# Patient Record
Sex: Male | Born: 1935 | Race: White | Hispanic: No | Marital: Single
Health system: Southern US, Community
[De-identification: ages and names within clinical notes are randomized; demographics above are authoritative.]

---

## 2005-10-04 ENCOUNTER — Inpatient Hospital Stay: Payer: Self-pay | Admitting: Internal Medicine

## 2005-10-05 ENCOUNTER — Other Ambulatory Visit: Payer: Self-pay

## 2005-11-14 ENCOUNTER — Ambulatory Visit: Payer: Self-pay | Admitting: Internal Medicine

## 2005-11-16 ENCOUNTER — Inpatient Hospital Stay: Payer: Self-pay | Admitting: Internal Medicine

## 2005-11-16 ENCOUNTER — Ambulatory Visit: Payer: Self-pay | Admitting: Gastroenterology

## 2005-11-16 ENCOUNTER — Other Ambulatory Visit: Payer: Self-pay

## 2005-12-25 ENCOUNTER — Emergency Department: Payer: Self-pay | Admitting: Emergency Medicine

## 2005-12-25 ENCOUNTER — Other Ambulatory Visit: Payer: Self-pay

## 2006-01-05 ENCOUNTER — Ambulatory Visit: Payer: Self-pay | Admitting: Nephrology

## 2006-03-01 ENCOUNTER — Emergency Department: Payer: Self-pay | Admitting: Emergency Medicine

## 2006-03-01 ENCOUNTER — Ambulatory Visit: Payer: Self-pay | Admitting: Cardiology

## 2006-03-01 ENCOUNTER — Inpatient Hospital Stay (HOSPITAL_COMMUNITY): Admission: AD | Admit: 2006-03-01 | Discharge: 2006-03-13 | Payer: Self-pay | Admitting: Neurology

## 2006-03-02 ENCOUNTER — Encounter: Payer: Self-pay | Admitting: Internal Medicine

## 2006-03-05 ENCOUNTER — Encounter (INDEPENDENT_AMBULATORY_CARE_PROVIDER_SITE_OTHER): Payer: Self-pay | Admitting: Neurology

## 2006-03-06 ENCOUNTER — Encounter: Payer: Self-pay | Admitting: Internal Medicine

## 2006-03-07 ENCOUNTER — Ambulatory Visit: Payer: Self-pay | Admitting: Infectious Diseases

## 2006-03-16 ENCOUNTER — Ambulatory Visit: Payer: Self-pay | Admitting: Nephrology

## 2006-03-26 ENCOUNTER — Ambulatory Visit: Payer: Self-pay | Admitting: Nephrology

## 2006-05-19 ENCOUNTER — Inpatient Hospital Stay (HOSPITAL_COMMUNITY): Admission: AD | Admit: 2006-05-19 | Discharge: 2006-05-23 | Payer: Self-pay | Admitting: Neurology

## 2006-05-19 ENCOUNTER — Emergency Department: Payer: Self-pay | Admitting: Emergency Medicine

## 2006-05-21 ENCOUNTER — Encounter (INDEPENDENT_AMBULATORY_CARE_PROVIDER_SITE_OTHER): Payer: Self-pay | Admitting: Neurology

## 2006-05-21 ENCOUNTER — Encounter (INDEPENDENT_AMBULATORY_CARE_PROVIDER_SITE_OTHER): Payer: Self-pay | Admitting: Interventional Cardiology

## 2006-08-30 ENCOUNTER — Ambulatory Visit: Payer: Self-pay | Admitting: Internal Medicine

## 2007-07-09 ENCOUNTER — Ambulatory Visit: Payer: Self-pay | Admitting: *Deleted

## 2007-07-09 ENCOUNTER — Encounter (INDEPENDENT_AMBULATORY_CARE_PROVIDER_SITE_OTHER): Payer: Self-pay | Admitting: Emergency Medicine

## 2007-07-09 ENCOUNTER — Inpatient Hospital Stay (HOSPITAL_COMMUNITY): Admission: EM | Admit: 2007-07-09 | Discharge: 2007-07-12 | Payer: Self-pay | Admitting: Emergency Medicine

## 2007-07-09 ENCOUNTER — Encounter (INDEPENDENT_AMBULATORY_CARE_PROVIDER_SITE_OTHER): Payer: Self-pay | Admitting: *Deleted

## 2007-07-10 ENCOUNTER — Ambulatory Visit: Payer: Self-pay | Admitting: Physical Medicine & Rehabilitation

## 2007-07-11 ENCOUNTER — Encounter (INDEPENDENT_AMBULATORY_CARE_PROVIDER_SITE_OTHER): Payer: Self-pay | Admitting: *Deleted

## 2007-07-12 ENCOUNTER — Inpatient Hospital Stay (HOSPITAL_COMMUNITY)
Admission: RE | Admit: 2007-07-12 | Discharge: 2007-07-23 | Payer: Self-pay | Admitting: Physical Medicine & Rehabilitation

## 2007-07-12 ENCOUNTER — Ambulatory Visit: Payer: Self-pay | Admitting: Physical Medicine & Rehabilitation

## 2008-10-05 ENCOUNTER — Inpatient Hospital Stay: Payer: Self-pay | Admitting: Internal Medicine

## 2009-04-16 ENCOUNTER — Inpatient Hospital Stay: Payer: Self-pay | Admitting: Internal Medicine

## 2009-05-10 ENCOUNTER — Ambulatory Visit: Payer: Self-pay | Admitting: Vascular Surgery

## 2009-06-07 ENCOUNTER — Inpatient Hospital Stay: Payer: Self-pay | Admitting: Internal Medicine

## 2009-07-19 ENCOUNTER — Emergency Department: Payer: Self-pay | Admitting: Emergency Medicine

## 2009-08-17 ENCOUNTER — Inpatient Hospital Stay: Payer: Self-pay | Admitting: Internal Medicine

## 2010-01-22 ENCOUNTER — Inpatient Hospital Stay: Payer: Self-pay | Admitting: Internal Medicine

## 2010-06-28 NOTE — Consult Note (Signed)
NAMEJASIR, [name]             ACCOUNT NO.:  [id number]   MEDICAL RECORD NO.:  [id number]          PATIENT TYPE:  INP   LOCATION:  3003                         FACILITY:  [facility]   PHYSICIAN:  Aram Beecham B. Eliott Nine, M.D.DATE OF BIRTH:  27-Dec-1922   DATE OF CONSULTATION:  DATE OF DISCHARGE:                                 CONSULTATION   We were asked to see this patient by [facility] Team, Dr. Darene Lamer.   REASON FOR CONSULTATION:  Hemodialysis.   HISTORY OF PRESENT ILLNESS:  This is an 77 year old male with a history  of hemorrhagic stroke admitted for right upper extremity weakness who  was found to have new acute left MCA multifocal infarct without  hemorrhage.  We were asked to see the patient for hemodialysis  disorders.  Per the patient and his family, he used to be 3 days a week  dialysis and then switched to 2, back to 3, now for about the past month  on a 2 days of dialysis Mondays and Fridays.  He has done well with this  regimen.  He is dialyzed for volume only. Of note, the patient's family  states that he tends to clot at about 2 hours and 30 minutes to 2 hours  and 45 minutes, although he is supposed to go 3 hours.  Over the top of  the course, the patient's has had an improved strength on the right  side.   PAST MEDICAL HISTORY:  History of:  1. Hemorrhagic stroke in 2008.  2. End-stage renal disease secondary to emboli, status post catheter      trauma.  3. History of aortic valve endocarditis.  4. CHF with EF of 20%-30%.  5. Aortic stenosis.  6. Afib.  7. Hyperlipidemia.   MEDICATIONS:  Currently on:  1. Aspirin 81 mg.  2. Celexa 20 mg daily.  3. Catapres 0.1 b.i.d.  4. Plavix 25 mg.  5. Cardizem 120 mg daily.  6. Lovenox 30 mg.  7. Toprol-XL 200 mg daily.  8. Protonix 40 mg daily.  9. Renagel 400 mg t.i.d.  Of note, the patient is on pravastatin at home 80 mg daily as well as  Lasix 20 mg daily.  At this hospital, the patient is currently on Zocor  40 mg  nightly.   Dialysis:  He dialyzes at [facility] [place]; however, he sees a  [facility] physician.  He dialysis Mondays and Wednesdays.  Estimated evaluated  sodium 1 kg, Lantus 3 hours, it was accessed with the PermaCath.  The  patient receives Epogen 1200 units with dialysis as well as Benicar 100  mL with dialysis.   SOCIAL HISTORY:  He lives with his son and daughter-in-law.  Denies  smoking, alcohol, or drugs.   FAMILY HISTORY:  Noncontributory as there are no kidney disease, stroke,  or hypertension.   REVIEW OF SYSTEMS:  Denies an API as well as denies vision changes,  chest pain, shortness of breath, dyspnea on exertion, muscle pain,  nausea, vomiting, or diarrhea.  Does complain of some lower extremity  edema and some weakness at his right side.   PHYSICAL  EXAMINATION:  VITAL SIGNS:  Temperature 98.4, pulse 70,  respiratory rate 20, blood pressure 160/100, previously 112/70, and O2  96% on room air.  I's and O's 480/0;  however, this is probably not  accurate.  GENERAL:  Not in acute distress.  HEENT:  Normocephalic and atraumatic.  Extraocular muscles are intact.  Sclerae are clear.  Moist mucous membranes.  NECK:  Supple without adenopathy.  No JVD.  CARDIOVASCULAR:  Regular rate and rhythm with 2/6 systolic ejection  murmur appreciated just over the right upper sternal border.  PULSES:  2+ equal bilaterally.  LUNGS:  Clear to auscultation bilaterally.  SKIN:  No lesions or rashes.  GI:  Soft and nontender.  Abdomen, no rebound or guarding.  EXTREMITIES:  He has 1+ pitting pedal edema.  ACCESS:  Perm graft without erythema or fluctuant discharge.  MUSCULOSKELETAL:  No joint deformities.  NEURO EXAM:  Alert and oriented x3.  Cranial nerves II through XII  grossly intact.  His right upper extremity has only 1/5 strength,  however he moves all fingers and has a strong grip.  He has weakness of  his right shoulder.  His left upper extremity is within normal limits.  His  lower extremities are actually equal bilaterally.   Head CT shows no new findings.  MRI showed acute posterior division left  MCA and multifocal infarct.  Superimposed over advanced chronic ischemic  disease.  Chest x-ray is negative.   LABORATORY DATA:  White blood cell 8.7, hemoglobin 14, hematocrit 11.6,  and platelets 207.  Sodium 141, potassium 4.2, chloride 105, bicarb 23,  BUN 17, creatinine 2.05, calcium 9.2, albumin 3.1.  AST 17, ALT 15, TSH  2.388, and INR is 1.   ASSESSMENT:  An 77 year old male with end-stage renal diseaseand  hemorragic  stroke.  1. End-stage renal disease:  Monday, Wednesday, and Friday      hemodialysis.  We have obtained records.  2. Acute cardiovascular accident:  Per primary team and neuro, patient      is on Plavix and aspirin.  He will go to rehab soon.  The patient      did not receive TPA.  3. Hypertension elevated currently but previously controlled.      Hemodialysis should help lower his blood pressure somewhat.  Per      primary team, clonidine and metoprolol.  4. Atrial fibrillation rate controlled now and actually is in sinus      rhythm.  Not on Coumadin secondary to previous      hemorrhagic stroke.  5. Congestive heart failure.  On Toprol-XL and aspirin.  6. Secondary hypoparathyroidism.  He does not appear to be on any      medication.      Johney Maine, M.D.  Electronically Signed     ______________________________  Duke Salvia Eliott Nine, M.D.    JT/MEDQ  D:  07/12/2007  T:  07/12/2007  Job:  [id number]

## 2010-06-28 NOTE — Consult Note (Signed)
NAMEBUZZ, AXEL             ACCOUNT NO.:  [id number]   MEDICAL RECORD NO.:  [id number]          PATIENT TYPE:  INP   LOCATION:  3003                         FACILITY:  [facility]   PHYSICIAN:  Deanna Artis. Hickling, M.D.DATE OF BIRTH:  05/20/1922   DATE OF CONSULTATION:  07/09/2007  DATE OF DISCHARGE:                                 CONSULTATION   CHIEF COMPLAINT:  Evaluate new left brain stroke.   HISTORY OF THE PRESENT CONDITION:  [name] is an 77 year old  gentleman who was admitted with a left posterior cerebral artery  distribution hemorrhagic stroke in January 2008.  The patient has  chronic renal failure and is on dialysis.  He recovered fairly well from  the stroke, and though he had problems with right visual field cut, he  was able to get around quite well with some help in terms of reminding  him to steer clear of objects in his path that he did not see.   The patient underwent dialysis and came home around 1:30 in the  afternoon of Jul 08, 2007.  He was last seen normal around 6 p.m.  He  felt tired and went down and took a nap until around 9:30.  When he  wakened, the right arm felt weak.  He was brought directly from his home  in [place] to __________ at [facility] at 11:20 p.m.  He was  beyond the limits of any rational therapy for him and for that reason,  he was admitted to the hospital for observation and evaluation.  He was  outside the [place] where interventional therapy could benefit him and  therefore was admitted on the hospital service for further evaluation  and we were consulted to assist in that evaluation.   PAST MEDICAL HISTORY:  The patient's initial stroke occurred in January  2008.  At that time, he was noted to have atrial fibrillation and also  aortic valve endocarditis.  He has a history of congestive heart failure  and has an ejection fraction of 20%-30%.  He has end-stage renal disease  and is on hemodialysis.  His risk  factors for stroke include  hypertension and dyslipidemia, in addition to his heart abnormalities.   PAST SURGICAL HISTORY:  The patient has had bilateral iridectomies.  He  has also had implantation of AV fistula for hemodialysis.  I am not  aware of other surgical procedures that the patient has had.   CURRENT MEDICATIONS:  1. Aspirin 325 mg daily.  2. Nephro-Vite 1 tablet daily.  3. Pravastatin 80 mg daily.  4. Cardizem CD 120 mg daily.  5. Metoprolol 200 mg daily.  6. Prilosec 20 mg daily.  7. Renagel 40 mg twice daily.  8. Celexa 20 mg daily.  9. Clonidine 0.1 mg twice daily.  10.Lasix 20 mg three times daily.   DRUG ALLERGIES:  HYDROCHLOROTHIAZIDE, SPIRIVA, and UROXATRAL.  I do not  know the reactions that he has had to those.   FAMILY HISTORY:  No history of strokes, renal failure, or hypertension.   SOCIAL HISTORY:  The patient lives with his son.  He walks with a  walker.  He does not use tobacco, alcohol, or drugs.   REVIEW OF SYSTEMS:  Twelve-system review is negative except as noted  above for other extensive medical problems.   PHYSICAL EXAMINATION:  GENERAL:  Today, this is a pleasant gentleman  sitting in chair with family surrounding him.  VITAL SIGNS:  Temperature 97.9, blood pressure 157/91, resting pulse 78,  respirations 18, oxygen saturation 97% on room air, weight 68.85 kg, and  height 71 inches.  EARS, NOSE, AND THROAT:  No infections.  No bruits.  NECK:  Supple neck.  LUNGS:  Clear.  HEART:  No murmurs.  Pulses normal.  ABDOMEN:  Soft.  Bowel sounds normal.  No hepatosplenomegaly.  EXTREMITIES:  Normal.  NEUROLOGIC EXAMINATION:  Mental Status:  The patient is awake.  His  memory is only fair.  He is able to name objects and follow simple  commands.  Cranial Nerves:  Round and reactive pupils, status post  iridectomy.  Visual fields show dense right homonymous hemianopsia.  Fundi show mild pallor of the disks.  He has a mild right central  seventh.   He can protrude his tongue and move it from side-to-side.  His  extraocular movements are full.   Motor Examination:  The patient's right arm sits in his lap.  He is not  able to elevate it against gravity.  He can flex the elbow 2/5, he  cannot extend it.  He has a wrist drop.  His grip is about 4/5.  He can  wiggle his fingers.  He can actually oppose his thumb to all 5 fingers  on the right side.  Shoulder shrug is 4/5.  Right leg, hip flexors and  psoas 4/5; the rest is 5/5.  He wiggles his toes a bit better on the  left side than the right.  The left side is normal.   Sensation:  He has variable testing but his arm seems to be more  effective than the legs and the face.  He has astereognosis in the right  hand.  He is not neglecting his right side.   Cerebellar Examination:  Finger-to-nose and rapid alternating movements  were okay on the left, cannot be test on the right.  Gait, he falls to  the right side per his family, I did not get him up.  Deep tendon  reflexes are diminished.  He had bilateral flexor plantar responses.   IMPRESSION:  1. I have reviewed the patient's MRI scan and it shows 3 acute      nonhemorrhagic lesions involving the posterior frontal and parietal      lobes, probably the central and angular branches of the middle      cerebral artery.  In all likelihood, these are cardioembolic in      nature from atrial fibrillation, low ejection fraction, and aortic      valve disease. (434.11)  2. Remote left posterior cerebral artery distribution infarction,      previously hemorrhagic transformation.  3. Risk factors for stroke include hypertension and dyslipidemia, in      addition to his cardiac disease.   RECOMMENDATIONS:  I agree with 2-D echocardiogram and carotid Doppler  which are being done.  The Doppler shows right moderate homogeneous  plaque in the bifurcation in the internal carotid artery without  stenosis.  The left internal carotid shows  moderate heterogeneous plaque  in the bifurcation in the internal carotid, 60%-80% internal carotid  artery stenosis  on the low end of the scale.   Hemoglobin A1c 5.2, serum homocysteine 11.5, fasting lipid panel, total  cholesterol 125, HDL normal at 41, LDL normal at 72, triglycerides  normal at 62, and VLDL calculated at 12.   I would consider placing this man on Plavix 75 mg and place his aspirin  because of a lower chance of bleeding and equal efficacy in terms of  stroke.  Neither of these will work as well as Coumadin, but neither  will be as dangerous as Coumadin would be for a guy who has already had  hemorrhagic transformation and he will remain at risk for further  stroke.  To this, we need to add the significant carotid artery  stenosis, but also his MRA shows narrowing in the carotid siphon much  more so on the left than the right and very irregular middle cerebral  artery vessels on the left side in comparison with the right.  He shows  a left posterior cerebral artery that has been cut off reflecting his  previous stroke.  The proximal vertebrobasilar vessels look fine and  proximal carotid vessels look fine prior to the siphon.   I do not think that this is a gentleman who is going to be a good  candidate for endovascular treatment at this time.  I will discuss this  with my partner, Dr. Pearlean Brownie.  His swallowing study was passed and so he  is on a heart-healthy normal-consistency diet.  He needs physical and  occupational therapy and inpatient rehabilitation evaluation and because  of his 24/7 care, he will definitely benefit from rehab and should be  taken by them.   I appreciate the opportunity to participate in his care.  If you have  any questions, do not hesitate to contact me.      Deanna Artis. Sharene Skeans, M.D.  Electronically Signed     [facility]/MEDQ  D:  07/09/2007  T:  07/10/2007  Job:  [id number]   cc:   Elliot Cousin, M.D.  Alonna Buckler, MD

## 2010-06-28 NOTE — H&P (Signed)
[name], GOLDWIRE NO.:  [id number]   MEDICAL RECORD NO.:  [id number]          PATIENT TYPE:  IPS   LOCATION:  4039                         FACILITY:  [facility]   PHYSICIAN:  Ellwood Dense, M.D.   DATE OF BIRTH:  1922/02/24   DATE OF ADMISSION:  07/12/2007  DATE OF DISCHARGE:                              HISTORY & PHYSICAL   PRIMARY CARE PHYSICIAN:  Dr. Randa Lynn in [place].   RENAL:  Dr. Hoy Morn in [place].   NEUROLOGIST:  Dr. Sharene Skeans.   LOCAL NEPHROLOGIST:  [facility].   HISTORY OF PRESENT ILLNESS:  Mr. [name] is an 77 year old Caucasian  male with history of left posterior circulation hemorrhage February 21, 2007, which left him with very little residual deficits, although he has  slight vision problems with peripheral vision on the right.  He also has  a history of end-stage renal disease for approximately the past year and  half, but presently he is on hemodialysis just twice a week with some  improvement in kidney function reported.   The patient was admitted to the hospital on Jul 09, 2007 with new right-  sided weakness.  MRI study of the brain showed an acute posterior left  middle cerebral artery distribution infarct with evidence of multiple  focal infarcts diffusely.  MRI study of the brain showed an acute  proximal occlusion.  Echocardiogram showed an ejection fraction of 55-  60% without emboli.  Carotid Doppler showed left internal carotid artery  stenosis of 60-80%.  Dr. Sharene Skeans from Neurology recommended aspirin and  Plavix and subcu Lovenox for DVT prophylaxis.  [facility] have been arranging dialysis presently q. Monday and Friday.  The patient was evaluated by the Rehabilitation physicians and felt to  be an appropriate candidate for inpatient rehabilitation.   REVIEW OF SYSTEMS:  Positive for reflux.   PAST MEDICAL HISTORY:  1. History of posterior circulation hemorrhage in January 2008 with    minimal residual deficits, although with some visual problems on      the right.  2. End-stage renal disease with hemodialysis for 18 months, presently      on hemodialysis 2 times per week.  3. Hypertension.  4. Dyslipidemia.  5. Congestive heart failure with ejection fraction of 20-30%.  6. Atrial fibrillation.  7. Aortic valve endocarditis.  8. Depression.   FAMILY HISTORY:  Positive for coronary artery disease.   SOCIAL HISTORY:  The patient lives with one of his sons and his daughter-  in-law in home with 24-hour assistance.  There is also a local health  that is hired.  He is not left alone anytime of the day.  He does get  out on a regular basis and in fact attended music festival 3 days just  couple weeks ago.  The patient does not use alcohol or tobacco.  He  lives in one level home with four steps to enter.   FUNCTIONAL HISTORY PRIOR TO ADMISSION:  Independent for the most part  using a walker for fairly good distances.   ALLERGIES:  UROXATRAL, HYDROCHLOROTHIAZIDE, SPIRIVA.   MEDICATIONS  PRIOR TO ADMISSION:  1. Aspirin daily.  2. Nephro-Vite daily.  3. Pravastatin 80 mg daily.  4. Cardizem 120 mg daily.  5. Metoprolol 200 mg daily.  6. Lasix 20 mg q. Tuesday, Thursday, Saturday, and Sunday.  7. Clonidine 0.1 mg b.i.d.  8. Celexa 20 mg daily.  9. Renagel 400 mg b.i.d.  10.Prilosec 20 mg daily.   LABORATORY:  Recent hemoglobin was 14.0 with hematocrit of 41.6,  platelet count of 207,000, white count of 8.7.  Recent hemoglobin A1c  was 5.2.  Recent sodium is 141, potassium 4.2, chloride 105, bicarbonate  28, BUN 17, and creatinine 2.05.   PHYSICAL EXAMINATION:  GENERAL:  Reasonably well-appearing elderly adult  male lying in bed with his son present.  VITAL SIGNS:  Blood pressure is 157/88 with pulse of 65, respiratory  rate 20, and temperature 98.0.  HEENT:  Normocephalic and nontraumatic.  CARDIOVASCULAR:  Irregular rate and rhythm, S1-S2 without murmurs.   ABDOMEN:  Soft, nontender with positive bowel sounds.  LUNGS:  Clear to auscultation bilaterally.  NEUROLOGIC:  Alert and oriented x2-3 with occasional Qs.  Cranial nerve  exam showed decreased visual acuity in the right peripheral field, but  extraocular muscles were intact and tongue was in the midline.  Facial  symmetry was intact and sensation was intact in the bilateral face.  Left upper and left lower extremity strength were generally 4+/5.  Bulk  was decreased and tone was normal.  Sensation was intact to light touch  in the left upper and left lower extremity.  Right upper extremity  strength was 3-/5 with decreased sensation and decreased tone with  fairly decreased bulk.  Right lower extremity strength was generally 3+  to 4-/5 with decreased sensation.   IMPRESSION:  1. Status post left middle cerebral artery infarct with right      hemiparesis and right hemisensory loss.  2. History of prior left posterior circulation hemorrhage January      20 08.  3. End-stage renal disease, presently on hemodialysis 2 times per      week.  4. Atrial fibrillation.   Presently, the patient has deficits in ADLs, transfers and ambulation  related to the above-noted stroke.   PLAN:  1. Admit to the rehabilitation unit for daily therapies to include      physical therapy for range of motion, strengthening, bed mobility,      transfers, pregait training, gait training, and equipment      evaluation.  2. Occupation therapy for range of motion, strengthening, ADLs,      cognitive/perceptual training, splinting, and equipment evaluation.  3. Rehab nursing for skin care, wound care, and bowel and bladder      training if necessary.  4. Speech therapy for higher level cognition and evaluation of swallow      as necessary.  5. Case management to assess home environment, assist with discharge      planning, and arrange for appropriate followup care.  6. Social Work to assess family and social  support and assist in      discharge planning.  7. Continue renal diet 70-2-2, 1200 mL fluid restriction diet.  8. Check admission labs doing routine hemodialysis lab draws.  9. Tylenol 325 mg 1-2 tablets p.o. q.4 h. p.r.n. for pain.  10.Pravachol 80 mg p.o. nightly.  11.Routine turning to prevent skin breakdown.  12.Aspirin 81 mg p.o. daily.  13.Celexa 20 mg p.o. daily.  14.Clonidine 0.1 mg p.o. b.i.d.  15.Plavix 75 mg p.o.  daily.  16.Aranesp 12.5 mcg IV q. Friday after dialysis.  17.Cardizem 120 mg p.o. daily.  18.Subcu Lovenox 30 mg daily.  19.Venofer 100 mg IV q. Monday after dialysis.  20.Toprol 200 mg p.o. daily.  21.Protonix 40 mg p.o. daily.  22.Nephro-Vite one p.o. daily.  23.Renagel 400 mg p.o. b.i.d.  24.Dialysis per [facility] q. Monday and Friday.  25.Strict in's and out's.  26.Full supervision for meals.   PROGNOSIS:  Good.   ESTIMATED LENGTH OF STAY:  10-15 days.   GOALS:  Made assist to supervision ADLs, transfers, and ambulation with  the device.           ______________________________  Ellwood Dense, M.D.     DC/MEDQ  D:  07/12/2007  T:  07/13/2007  Job:  [id number]

## 2010-06-28 NOTE — Discharge Summary (Signed)
[name], [name]             ACCOUNT NO.:  [id number]   MEDICAL RECORD NO.:  [id number]          PATIENT TYPE:  INP   LOCATION:  3003                         FACILITY:  [facility]   PHYSICIAN:  Isidor Holts, M.D.  DATE OF BIRTH:  06/10/1922   DATE OF ADMISSION:  07/08/2007  DATE OF DISCHARGE:  07/12/2007                               DISCHARGE SUMMARY   PRIMARY MEDICAL DOCTOR:  Dr. Randa Lynn, [place], [place].   DISCHARGE DIAGNOSES:  1. Acute cerebrovascular accident, posterior division left middle      cerebral artery territory  2. End-stage renal disease on hemodialysis.  3. Hypertension.  4. Atrial fibrillation.  5. Dyslipidemia.  6. History of congestive cardiomyopathy.  7. History of left posterior cerebral artery distribution hemorrhagic      cerebrovascular accident, January 2008.   DISCHARGE MEDICATIONS:  1. Protonix 40 mg p.o. daily.  2. Nephro-Vite tablet 1 p.o. daily.  3. Cardizem CD 120 mg p.o. daily.  4. Metoprolol succinate (Toprol XL) 200 mg p.o. daily.  5. Celexa 20 mg p.o. daily.  6. Clonidine 0.1 mg p.o. b.i.d.  7. Renagel 400 mg p.o. b.i.d.  8. Plavix 75 mg p.o. daily.  9. Aspirin 81 mg p.o. daily.  10.Pravastatin 80 mg p.o. daily.   PROCEDURES:  1. Head CT scan dated Jul 09, 2007, this showed no acute abnormality.      There was stable old large left occipital and temporal lobe      infarct.  Stable extensive patchy white matter chronic small vessel      ischemic changes in both cerebral hemispheres, stable atrophy.  2. Chest x-ray dated Jul 09, 2007, this showed no acute thoracic      findings.  3. Brain MRI dated Jul 09, 2007, this showed acute posterior division      left MCA territory multifocal infarct, superimposed on advanced      chronic ischemic disease.  No associated acute hemorrhage or mass      effect.  4. Head MRA dated Jul 09, 2007, this was degraded by motion artifact.      There was stable intracranial atherosclerosis  without acute      proximal occlusion identified.  Decreased conspicuity of left ICA      siphon 4 mm aneurysm, probably due to suboptimal quality of this      exam, stable attenuated flow in the left PCA.  5. Bilateral carotid/vertebral artery duplex dated Jul 09, 2007, this      showed on the right moderate plaquing bifurcation, no ICA stenosis.      Vertebral artery not visualized.  On the left, moderate plaquing      with bifurcation, 60-80% ICA stenosis (low end of scale), vertebral      artery flow antegrade.  6. 2-D echocardiogram dated Jul 08, 2007, this showed overall normal      left ventricular systolic function, EF 50-60%.  No left ventricular      regional wall motion abnormalities.  LV wall thickness was mildly      to moderately increased.  The aortic valve was moderately  calcified.  Findings consistent with moderate aortic valve      stenosis.  Mean transaortic valve gradient was 90 mmHg.  Estimated      aortic valve area (by VTI) was 1.07 cm2.  Estimated aortic valve      area (by VMAX) was 1 cm2.  Compared to previous study of May 18, 2006, there has been no significant interval change.   CONSULTATIONS:  1. Dr. Ellison Carwin, neurologist.  2. Dr. Sunny Schlein. [place].  3. Nephrologist, [employer].   ADMISSION HISTORY:  As in H&P notes of Jul 09, 2007, dictated by Dr.  Oneita Hurt.  However, in brief, this is an 77 year old male, with known  history of cerebrovascular disease, status post left posterior  circulation hemorrhagic CVA, January 2008, end-stage renal disease on  hemodialysis, history of aortic valve endocarditis, July 2008, history  of congestive cardiomyopathy, aortic stenosis, atrial fibrillation and  dyslipidemia, who presented with sudden right upper extremity weakness  following dialysis, on day of presentation. He was admitted for further  evaluation, investigation and management.   CLINICAL COURSE:  1. Acute CVA.  For details  of presentation, refer to the admission      history above.  The patient was evaluated by Dr. Sharene Skeans,      neurologist.  He was not deemed to be candidate for thrombolytic      therapy.  He underwent CVA workup.  For details of findings, refer      to procedure list above.  This confirmed acute posterior division      left MCA territory multifocal infarcts.  The patient was      subsequently seen by Dr. Delia Heady.  Against the background of      the patient's chronic atrial fibrillation, it was clear that      further escalation of his antiplatelet/stoke prevention treatment      was imperative, however, given his background history of previous      hemorrhagic CVA, it was elected to continue Aspirin, and add Plavix      to his treatment.  Other risk factor modifications were continued.   1. Dyslipidemia.  The patient's lipid profile was as follows:  Total      cholesterol 125, triglyceride 62, HDL 41, LDL 72, i.e. excellent      lipid profile.  The patient has been continued on pre-admission      dosage of statin.   1. End-stage renal disease.  The patient usually gets hemodialysis on      Mondays and Fridays.  The renal team was contacted, and saw the      patient on Jul 11, 2007.  He is due to commence his dialysis on      Friday, Jul 12, 2007.   1. History of congestive cardiomyopathy.  The patient's ejection      fraction per 2-D echocardiogram on Jul 09, 2007, was normal, i.e.      between 50%-60%.  He does have moderate aortic stenosis.  However,      during the course of his hospitalization, there were no clinical      features of congestive heart failure.   1. History of atrial fibrillation.  This was rate controlled on a      combination of Cardizem and Toprol XL.   DISPOSITION:  The patient was evaluated by rehabilitation team and  considered an excellent candidate for inpatient rehabilitation.  Meanwhile, he continues ongoing physiotherapy  and occupational  therapy,  and is anticipated to be transferred to the rehabilitation unit on Jul 12, 2007.   DIET:  Heart-healthy.   ACTIVITY:  Per PT/OT/Rehab.      Isidor Holts, M.D.  Electronically Signed     CO/MEDQ  D:  07/11/2007  T:  07/11/2007  Job:  [id number]   cc:   Alonna Buckler, M.D.

## 2010-06-28 NOTE — Discharge Summary (Signed)
[name], [name]             ACCOUNT NO.:  [id number]   MEDICAL RECORD NO.:  [id number]          PATIENT TYPE:  IPS   LOCATION:  4039                         FACILITY:  [facility]   PHYSICIAN:  Mariam Dollar, P.A.  DATE OF BIRTH:  [birth year]   DATE OF ADMISSION:  07/12/2007  DATE OF DISCHARGE:                               DISCHARGE SUMMARY   DISCHARGE DIAGNOSES:  1. Left middle cerebral artery infarction, right hemiplegia.  2. History of left posterior circulation hemorrhage in January 2008.  3. End-stage renal disease with hemodialysis.  4. Hypertension.  5. Atrial fibrillation.  6. Hyperlipidemia.  7. Depression.  8. Anemia.  9. Subcutaneous Lovenox for deep vein thrombosis prophylaxis.   This is an 77 year old male with history of left posterior circulation  hemorrhage, January 2008 with very little residual as well as end-stage  renal disease with hemodialysis.  The patient admitted on Jul 09, 2007  with right-sided weakness.  MRI showed acute posterior division left  middle cerebral artery multifocal focal infarction.  MRA with acute  proximal occlusion.  Echocardiogram with ejection fraction of 55-60%  without emboli.  Carotid Dopplers with left 60-80% internal carotid  artery stenosis.  Neurology Consultant, Dr. Sharene Skeans placed on aspirin  and Plavix therapy.  Subcutaneous Lovenox initiated for deep vein  thrombosis prophylaxis.   [facility] followup arranged  dialysis Mondays and Fridays as he was initially receiving dialysis in  [facility].   PAST MEDICAL HISTORY:  See discharge diagnoses.  No alcohol or tobacco.   ALLERGIES:  Uroxatral, hydrochlorothiazide, and Spiriva.   SOCIAL HISTORY:  He lives with son and daughter-in-law.  He has 24-hour  assistance as needed.  One level home, four steps to entry.   MEDICATIONS PRIOR TO ADMISSION:  Aspirin daily, Nephro-Vite daily,  pravastatin 80 mg daily, Cardizem 120 mg daily, metoprolol 200 mg daily,  Prilosec 20 mg daily, Renagel 400 mg twice daily, Celexa 20 mg daily,  clonidine 0.1 mg twice daily and Lasix 20 mg Tuesday, Thursday,  Saturday, and Sunday.   REHABILITATION HOSPITAL COURSE:  The patient was admitted to Inpatient  Rehab Services with therapies initiated on a 3-hour daily basis  consisting of physical therapy, occupational therapy, speech therapy,  and rehabilitation nursing.  The following issues were addressed during  the patient's rehabilitation stay.  Pertaining to [name]'s left  middle cerebral artery infarction with right hemiplegia, he continued to  do nicely.  He was maintained on aspirin and Plavix therapy.  Functionally, he was minimal assist to close supervision for basic  mobility, minimal assist bathing, moderate assist lower body dressing,  moderate assist toileting.  He was able to communicate his own needs.  He was continent of bowel and bladder.  His therapies would be ongoing  as per rehab services.  He remained on subcutaneous Lovenox for deep  vein thrombosis prophylaxis throughout his rehab course.  He had a  history of end-stage renal disease.  He was receiving dialysis on a  Monday and Friday basis.  He would followup with Dr. Hoy Morn of [facility] for his ongoing dialysis.  His  blood pressures were  monitored with Cardizem as well as Toprol and clonidine with diastolic  pressures 60-75.  He denied any headache, nausea or vomiting.  He had a  documented history of depression as he remained on Celexa 20 mg daily.  He remained motivated to continue with his ongoing therapies.   Latest labs showed hemoglobin 14.9, hematocrit 44, WBC 12.6, platelet  193,000, sodium 137, potassium 3.8, BUN 30, and creatinine 2.50.   DISCHARGE MEDICATIONS:  At time of dictation included aspirin 81 mg p.o.  daily, Celexa 20 mg daily, Plavix 75 mg daily, Cardizem 120 mg daily,  Toprol XL 200 mg daily, Protonix 40 mg daily, Nephro-Vite daily, Renagel   400 mg twice daily, Zocor 40 mg at bedtime, and clonidine 0.05 mg twice  daily.   ACTIVITY:  As tolerated with assistance for safety, diet was a renal  diet.   SPECIAL INSTRUCTIONS:  The patient should continue dialysis at [facility] with Dr. Karoline Caldwell prior to hospital admission.  He will see Dr.  Ellwood Dense at the Outpatient Rehab Service Office as advised, Dr.  Randa Lynn in [place], [facility].      Mariam Dollar, P.A.     DA/MEDQ  D:  07/22/2007  T:  07/22/2007  Job:  [id number]   cc:   Ellwood Dense, M.D.  Dr. Randa Lynn  Dr. Sharene Skeans  Dr. Karoline Caldwell

## 2010-06-28 NOTE — H&P (Signed)
NAMECATCHER, DEHOYOS NO.:  [id number]   MEDICAL RECORD NO.:  [id number]          PATIENT TYPE:  EMS   LOCATION:  MAJO                         FACILITY:  [facility]   PHYSICIAN:  Madaline Savage, MD        DATE OF BIRTH:  30-Mar-1922   DATE OF ADMISSION:  07/09/2007  DATE OF DISCHARGE:                              HISTORY & PHYSICAL   PRIMARY CARE PHYSICIAN:  Dr. Randa Lynn of [place], [place].  This  patient is unassigned to [place].   PRIMARY NEPHROLOGIST:  Dr. Hoy Morn in [place], [place].   CHIEF COMPLAINT:  Weakness of the right arm.   HISTORY OF PRESENT ILLNESS:  Mr. Lage is an 77 year old gentleman  with a history of left posterior circulation hemorrhagic stroke in  January 2008, who now comes in with right upper extremity weakness.  After his stroke in January 2008 he apparently recovered completely, and  was almost at his baseline in 6 months.  He was able to move his right  upper extremity and he was able to walk with a walker.  He got dialyzed  today and came back, and apparently was last seen normal at  approximately 6:00 p.m.  At that time he told his daughter-in-law that  he wanted to rest for a while and went to sleep.  He then woke up at  approximately 9:30 p.m. and he said that his right arm felt weak.  He  denied having any headaches, any dizziness, any speech disturbance or  any deviation of the month.  Then 9-1-1 was called and he was brought to  the emergency room.  He feels that his right arm weakness has improved  slightly since coming to the hospital.  He also feels his right lower  extremity is mildly weak.   PAST MEDICAL HISTORY:  1. Left posterior circulation hemorrhagic stroke in January 2008.  2. End-stage renal disease, on hemodialysis on Mondays and Fridays.  3. History of aortic valve endocarditis in January 2008.  4. History of congestive cardiac failure, with ejection fraction of 20-      30%.  5. History of aortic  stenosis.  6. Atrial fibrillation.  7. Dyslipidemia.   ALLERGIES:  She is allergic to HYDROCHLOROTHIAZIDE, SPIRIVA and  UROXATRAL.   CURRENT MEDICATIONS:  1. Aspirin 325 mg daily.  2. Nephro-Vite one tablet daily.  3. Pravastatin 80 mg daily.  4. Cardizem CD 120 mg daily.  5. Metoprolol 200 mg daily.  6. Prilosec 20 mg daily.  7. Renagel 40 mg twice daily.  8. Celexa 20 mg daily.  9. Clonidine 0.1 mg twice daily.  10.Lasix 20 mg three times daily.   SOCIAL HISTORY:  He lives at home with his son.  He walks with a walker.  He denies any history of smoking, alcohol or drug abuse.   FAMILY HISTORY:  Noncontributory.   REVIEW OF SYSTEMS:  GENERAL:  He denies any recent weight loss or weight  gain.  No fever or chills.  HEENT:  No headaches, and no sore throat.  CARDIOVASCULAR SYSTEM:  Denies chest pain, palpitations.  RESPIRATORY  SYSTEM:  No shortness of breath or cough.  GI:  No abdominal pain,  nausea, vomiting, diarrhea, or constipation.  GENITOURINARY SYSTEM:  Denies any dysuria or polyuria.   PHYSICAL EXAMINATION:  She is alert and oriented x3.  VITAL SIGNS:  Temperature 97.3, pulse rate 74, blood pressure 133/80,  respiratory rate 20 per minute, oxygen saturation 97% on room air.  HEENT:  Head atraumatic, normocephalic.  Pupils bilaterally equally  round and reactive to light.  Mucous membranes appear moist.  NECK:  Supple.  No JVD, no carotid bruits.  CARDIOVASCULAR:  S1 and S2 heard. Rhythm regular.  CHEST:  Clear to auscultation.  ABDOMEN:  Soft.  Bowel sounds heard.  EXTREMITIES:  There was no edema, cyanosis or clubbing.  CENTRAL NERVOUS SYSTEM:  There was grade 1/5 power in the right upper  extremities.  Reflexes are present.  There is mild sensory loss in the  right upper extremity.  The right lower extremity has 5/5 power.  Cranial nerves are all intact.   LABS:  White count 88, hemoglobin 39, platelets 191.  Sodium 130,  potassium 4.2, chloride 102, bicarb  30, BUN 16, creatinine 1.96.  His  last creatinine in April 2008 was 2.75.  His glucose was 115.  Calcium  8.6, INR 1.0.  A CT of the head without contrast shows no acute  abnormality; stable over the left occipital and temporal lobe infarcts.   IMPRESSION:  1. Right upper extremity weakness; likely cerebrovascular accident.  2. End-stage renal disease, on hemodialysis.  3. History of congestive cardiac failure.  4. History of atrial fibrillation.  5. History of hemorrhagic cerebrovascular accident in the past.  6. Dyslipidemia.   PLAN:  1. Acute Cerebrovascular Accident.  This is an 77 year old male with a      history of CVA in the past, who comes in with right upper extremity      weakness.  This is likely left middle cerebral artery stroke.  The      CT scan does not show any hemorrhage.  We will admit him to the      Neuro Floor.  We will get an MRI, and I will also get a 2-D echo      and carotid Dopplers on him.  I will continue his aspirin.  We will      consult neurology to see him.  He may be a candidate for Aggrenox.      He will need aggressive risk factor modification.  We will get a      fasting lipid profile, hemoglobin A1c, TSH and homocysteine levels      while he is here in the hospital.  I will also consult physical      therapy and occupational therapy.  2. End-stage Renal Disease.  On hemodialysis; he gets hemodialyzed      twice a week.  He had hemodialysis today.  He is supposed to get      his dialysis done again on Friday.  If he stays in the hospital      until then, we will consult Nephrology.  3. History of Congestive Cardiac Failure.  At this time his volume      status appears stable.  I will hydrate him very gently at this      time.  4. History of Atrial Fibrillation.  It is rate controlled at this      time.  He is not on anticoagulation because of history of  hemorrhagic cerebrovascular accident.  5. I will put him on deep vein thrombosis  prophylaxis.      Madaline Savage, MD  Electronically Signed     PKN/MEDQ  D:  07/09/2007  T:  07/09/2007  Job:  [id number]

## 2010-07-01 NOTE — Op Note (Signed)
[name], [name]             ACCOUNT NO.:  [id number]   MEDICAL RECORD NO.:  [id number]          PATIENT TYPE:  INP   LOCATION:  3020                         FACILITY:  [facility]   PHYSICIAN:  Quita Skye. Hart Rochester, M.D.  DATE OF BIRTH:  1922/07/19   DATE OF PROCEDURE:  03/11/2006  DATE OF DISCHARGE:                               OPERATIVE REPORT   PREOPERATIVE DIAGNOSIS:  End-stage renal disease.   POSTOP DIAGNOSES:  End-stage renal disease.   PROCEDURES:  1. Bilateral ultrasound localization, internal jugular veins.  2. Insertion of Diatek catheter via left internal jugular vein (28      cm).   SURGEON:  Quita Skye. Hart Rochester, M.D.   FIRST ASSISTANT:  Nurse.   ANESTHESIA:  Local.   PROCEDURE:  The patient was taken to the operating room and  placed in  the supine position, at which time the upper chest and neck were exposed  and both internal jugular veins were imaged using B-mode ultrasound.  Both noted to be widely patent and normal in appearance.  After prepping  and draping in a routine sterile manner, the left internal jugular vein  was entered using a supraclavicular approach.  A guidewire was passed  into the right atrium under fluoroscopic guidance.  After dilating the  tract appropriately, a 28-cm Diatek catheter was positioned in the right  atrium, tunneled peripherally, secured with nylon sutures.  The wound  was closed with Vicryl in a subcuticular fashion.  Sterile dressing was  applied.  The patient was taken to recovery room in satisfactory  condition.           ______________________________  Quita Skye Hart Rochester, M.D.     JDL/MEDQ  D:  03/11/2006  T:  03/11/2006  Job:  [id number]

## 2010-07-01 NOTE — Procedures (Signed)
EEG NUMBER:  [id number].   CLINICAL HISTORY:  The patient is an 77 year old with renal failure,  stroke, recent fall and confusion.  Study is being done to look for the  presence of seizures, 780.39, 293.0.   PROCEDURE:  The tracing is carried out on a 32-channel digital Cadwell  recorder reformatted into 16 channel montages with one devoted to EKG.  The patient was awake during the recording.  The [employer] 10/20  system lead placement was used.   MEDICATIONS:  Aspirin, renal vitamins, diltiazem, Toprol, Protonix,  Renagel, Celexa, low-molecular-weight heparin, Catapres, among others.   DESCRIPTION OF FINDINGS:  Dominant frequency is a 6-7 Hz 20-45 microvolt  activity that is prominent throughout the record.  There is no  significant change in background.  There was no focal slowing.  There  was no interictal epileptiform activity in the form of spikes or sharp  waves.  Photic stimulation failed to induce a definite driving response.  EKG showed atrial fibrillation with ventricular response of 78 beats per  minute.   IMPRESSION:  Abnormal EEG on the basis of diffuse background slowing.  This is a nonspecific indicator of neuronal dysfunction that may be on a  primary degenerative basis, related to toxic or metabolic etiologies.  The findings require correlation with the patient's clinical contacts.      Deanna Artis. Sharene Skeans, M.D.  Electronically Signed     ZOX:WRUE  D:  05/22/2006 23:38:13  T:  05/23/2006 09:35:23  Job #:  [id number]

## 2010-11-09 LAB — BASIC METABOLIC PANEL
CO2: 28
CO2: 30
Calcium: 8.6
Chloride: 102
GFR calc Af Amer: 40 — ABNORMAL LOW
GFR calc non Af Amer: 33 — ABNORMAL LOW
GFR calc non Af Amer: 33 — ABNORMAL LOW
Glucose, Bld: 111 — ABNORMAL HIGH
Glucose, Bld: 115 — ABNORMAL HIGH
Potassium: 3.6
Potassium: 4.2
Sodium: 140

## 2010-11-09 LAB — RENAL FUNCTION PANEL
CO2: 26
Calcium: 8.8
Chloride: 107
Creatinine, Ser: 2.13 — ABNORMAL HIGH
Glucose, Bld: 106 — ABNORMAL HIGH

## 2010-11-09 LAB — CBC
HCT: 40.9
HCT: 41.3
HCT: 41.6
HCT: 42.6
Hemoglobin: 13.7
Hemoglobin: 14.3
MCHC: 33.6
MCHC: 33.6
MCHC: 33.7
MCV: 91.7
MCV: 92.2
Platelets: 207
RBC: 4.65
RDW: 15.4
RDW: 15.9 — ABNORMAL HIGH
RDW: 15.9 — ABNORMAL HIGH
WBC: 8.8

## 2010-11-09 LAB — DIFFERENTIAL
Basophils Absolute: 0.1
Basophils Relative: 1
Eosinophils Relative: 4
Monocytes Absolute: 1.4 — ABNORMAL HIGH

## 2010-11-09 LAB — LIPID PANEL
LDL Cholesterol: 72
Triglycerides: 62
VLDL: 12

## 2010-11-09 LAB — COMPREHENSIVE METABOLIC PANEL
Albumin: 3.1 — ABNORMAL LOW
BUN: 17
Calcium: 9.2
Creatinine, Ser: 2.05 — ABNORMAL HIGH
Potassium: 4.2
Total Protein: 6.4

## 2010-11-09 LAB — PROTIME-INR: INR: 1

## 2010-11-09 LAB — HOMOCYSTEINE: Homocysteine: 11.5

## 2010-11-10 LAB — BASIC METABOLIC PANEL
BUN: 30 — ABNORMAL HIGH
CO2: 25
Chloride: 104
Creatinine, Ser: 2.88 — ABNORMAL HIGH
GFR calc Af Amer: 25 — ABNORMAL LOW
Potassium: 3.3 — ABNORMAL LOW

## 2010-11-10 LAB — RENAL FUNCTION PANEL
Albumin: 3 — ABNORMAL LOW
BUN: 29 — ABNORMAL HIGH
BUN: 30 — ABNORMAL HIGH
CO2: 25
Calcium: 8.6
Calcium: 8.7
Chloride: 104
Creatinine, Ser: 2.5 — ABNORMAL HIGH
Creatinine, Ser: 2.56 — ABNORMAL HIGH
GFR calc Af Amer: 30 — ABNORMAL LOW
GFR calc non Af Amer: 25 — ABNORMAL LOW
Glucose, Bld: 110 — ABNORMAL HIGH
Glucose, Bld: 123 — ABNORMAL HIGH
Phosphorus: 2.4

## 2010-11-10 LAB — CBC
HCT: 43.3
HCT: 44.3
HCT: 44.4
Hemoglobin: 14.9
MCHC: 33.5
MCHC: 34.3
MCHC: 34.6
MCV: 91.9
MCV: 92.1
MCV: 92.9
Platelets: 212
RBC: 4.82
RBC: 4.82
RDW: 15.5

## 2010-11-10 LAB — URINE MICROSCOPIC-ADD ON

## 2010-11-10 LAB — URINALYSIS, ROUTINE W REFLEX MICROSCOPIC
Ketones, ur: 15 — AB
Nitrite: POSITIVE — AB
Specific Gravity, Urine: 1.022
Urobilinogen, UA: 1
pH: 5.5

## 2010-11-10 LAB — IRON: Iron: 46

## 2010-11-10 LAB — URINE CULTURE: Special Requests: NEGATIVE

## 2010-12-15 ENCOUNTER — Observation Stay: Payer: Self-pay | Admitting: Internal Medicine

## 2011-03-21 ENCOUNTER — Emergency Department: Payer: Self-pay | Admitting: Emergency Medicine

## 2011-03-21 LAB — COMPREHENSIVE METABOLIC PANEL
Albumin: 3.1 g/dL — ABNORMAL LOW (ref 3.4–5.0)
Alkaline Phosphatase: 63 U/L (ref 50–136)
Bilirubin,Total: 0.7 mg/dL (ref 0.2–1.0)
Calcium, Total: 9 mg/dL (ref 8.5–10.1)
Co2: 26 mmol/L (ref 21–32)
EGFR (Non-African Amer.): 34 — ABNORMAL LOW
Glucose: 114 mg/dL — ABNORMAL HIGH (ref 65–99)
SGPT (ALT): 11 U/L — ABNORMAL LOW
Sodium: 142 mmol/L (ref 136–145)

## 2011-03-21 LAB — URINALYSIS, COMPLETE
Bacteria: NONE SEEN
Glucose,UR: NEGATIVE mg/dL (ref 0–75)
Hyaline Cast: 3
Ketone: NEGATIVE
Nitrite: NEGATIVE
Ph: 6 (ref 4.5–8.0)
Protein: NEGATIVE
RBC,UR: 2 /HPF (ref 0–5)
Specific Gravity: 1.006 (ref 1.003–1.030)
WBC UR: 13 /HPF (ref 0–5)

## 2011-03-21 LAB — CBC
HCT: 30 % — ABNORMAL LOW (ref 40.0–52.0)
HGB: 9.4 g/dL — ABNORMAL LOW (ref 13.0–18.0)
MCH: 21.4 pg — ABNORMAL LOW (ref 26.0–34.0)
RBC: 4.42 10*6/uL (ref 4.40–5.90)

## 2011-03-21 LAB — TROPONIN I: Troponin-I: 0.08 ng/mL — ABNORMAL HIGH

## 2011-03-21 LAB — CK TOTAL AND CKMB (NOT AT ARMC)
CK, Total: 24 U/L — ABNORMAL LOW (ref 35–232)
CK-MB: 1 ng/mL (ref 0.5–3.6)

## 2011-03-23 LAB — URINE CULTURE

## 2011-03-28 ENCOUNTER — Inpatient Hospital Stay: Payer: Self-pay | Admitting: Internal Medicine

## 2011-03-28 LAB — SEDIMENTATION RATE: Erythrocyte Sed Rate: 44 mm/hr — ABNORMAL HIGH (ref 0–20)

## 2011-03-28 LAB — DRUG SCREEN, URINE
Amphetamines, Ur Screen: NEGATIVE (ref ?–1000)
Barbiturates, Ur Screen: NEGATIVE (ref ?–200)
Benzodiazepine, Ur Scrn: NEGATIVE (ref ?–200)
Methadone, Ur Screen: NEGATIVE (ref ?–300)
Opiate, Ur Screen: NEGATIVE (ref ?–300)
Tricyclic, Ur Screen: NEGATIVE (ref ?–1000)

## 2011-03-28 LAB — URINALYSIS, COMPLETE
Bacteria: NONE SEEN
Bilirubin,UR: NEGATIVE
Blood: NEGATIVE
Glucose,UR: NEGATIVE mg/dL (ref 0–75)
Ketone: NEGATIVE
Nitrite: NEGATIVE
Ph: 5 (ref 4.5–8.0)
RBC,UR: 3 /HPF (ref 0–5)
Squamous Epithelial: NONE SEEN
WBC UR: 7 /HPF (ref 0–5)

## 2011-03-28 LAB — CBC WITH DIFFERENTIAL/PLATELET
Basophil %: 0 %
Eosinophil %: 0.8 %
HCT: 30.2 % — ABNORMAL LOW (ref 40.0–52.0)
Lymphocyte %: 6.5 %
MCHC: 30.2 g/dL — ABNORMAL LOW (ref 32.0–36.0)
Monocyte %: 15.1 %
Neutrophil #: 6.6 10*3/uL — ABNORMAL HIGH (ref 1.4–6.5)
Neutrophil %: 77.6 %

## 2011-03-28 LAB — COMPREHENSIVE METABOLIC PANEL
Albumin: 3.2 g/dL — ABNORMAL LOW (ref 3.4–5.0)
Anion Gap: 13 (ref 7–16)
BUN: 26 mg/dL — ABNORMAL HIGH (ref 7–18)
Chloride: 102 mmol/L (ref 98–107)
Creatinine: 1.99 mg/dL — ABNORMAL HIGH (ref 0.60–1.30)
Glucose: 109 mg/dL — ABNORMAL HIGH (ref 65–99)
Potassium: 4.2 mmol/L (ref 3.5–5.1)
SGOT(AST): 22 U/L (ref 15–37)
SGPT (ALT): 11 U/L — ABNORMAL LOW
Sodium: 139 mmol/L (ref 136–145)
Total Protein: 8.3 g/dL — ABNORMAL HIGH (ref 6.4–8.2)

## 2011-03-28 LAB — TROPONIN I: Troponin-I: 0.02 ng/mL

## 2011-03-28 LAB — CK TOTAL AND CKMB (NOT AT ARMC): CK-MB: 1 ng/mL (ref 0.5–3.6)

## 2011-03-29 LAB — BASIC METABOLIC PANEL
Anion Gap: 13 (ref 7–16)
BUN: 24 mg/dL — ABNORMAL HIGH (ref 7–18)
Chloride: 105 mmol/L (ref 98–107)
Co2: 23 mmol/L (ref 21–32)
Creatinine: 1.74 mg/dL — ABNORMAL HIGH (ref 0.60–1.30)
EGFR (Non-African Amer.): 40 — ABNORMAL LOW
Potassium: 4 mmol/L (ref 3.5–5.1)
Sodium: 141 mmol/L (ref 136–145)

## 2011-03-30 LAB — BASIC METABOLIC PANEL
Anion Gap: 11 (ref 7–16)
BUN: 21 mg/dL — ABNORMAL HIGH (ref 7–18)
Calcium, Total: 8.5 mg/dL (ref 8.5–10.1)
Chloride: 109 mmol/L — ABNORMAL HIGH (ref 98–107)
Creatinine: 1.64 mg/dL — ABNORMAL HIGH (ref 0.60–1.30)
EGFR (African American): 52 — ABNORMAL LOW
EGFR (Non-African Amer.): 43 — ABNORMAL LOW
Glucose: 98 mg/dL (ref 65–99)
Osmolality: 286 (ref 275–301)
Potassium: 4.2 mmol/L (ref 3.5–5.1)
Sodium: 142 mmol/L (ref 136–145)

## 2011-03-30 LAB — IRON AND TIBC
Iron Saturation: 4 %
Unbound Iron-Bind.Cap.: 311 ug/dL

## 2011-03-30 LAB — FERRITIN: Ferritin (ARMC): 40 ng/mL (ref 8–388)

## 2011-04-14 ENCOUNTER — Ambulatory Visit: Payer: Self-pay | Admitting: Internal Medicine

## 2011-04-17 ENCOUNTER — Inpatient Hospital Stay: Payer: Self-pay | Admitting: *Deleted

## 2011-04-17 LAB — URINALYSIS, COMPLETE
Bilirubin,UR: NEGATIVE
Blood: NEGATIVE
Ketone: NEGATIVE
Nitrite: NEGATIVE
Ph: 5 (ref 4.5–8.0)
Protein: 30
RBC,UR: 4 /HPF (ref 0–5)
Specific Gravity: 1.018 (ref 1.003–1.030)
Squamous Epithelial: NONE SEEN

## 2011-04-17 LAB — COMPREHENSIVE METABOLIC PANEL
Albumin: 2.5 g/dL — ABNORMAL LOW (ref 3.4–5.0)
Alkaline Phosphatase: 62 U/L (ref 50–136)
Anion Gap: 10 (ref 7–16)
BUN: 22 mg/dL — ABNORMAL HIGH (ref 7–18)
Calcium, Total: 8.5 mg/dL (ref 8.5–10.1)
Co2: 23 mmol/L (ref 21–32)
Creatinine: 1.66 mg/dL — ABNORMAL HIGH (ref 0.60–1.30)
EGFR (African American): 51 — ABNORMAL LOW
Glucose: 111 mg/dL — ABNORMAL HIGH (ref 65–99)
Osmolality: 293 (ref 275–301)
Potassium: 4.1 mmol/L (ref 3.5–5.1)
SGOT(AST): 19 U/L (ref 15–37)
Sodium: 145 mmol/L (ref 136–145)
Total Protein: 6.9 g/dL (ref 6.4–8.2)

## 2011-04-17 LAB — CBC
HCT: 30.5 % — ABNORMAL LOW (ref 40.0–52.0)
MCHC: 30 g/dL — ABNORMAL LOW (ref 32.0–36.0)
RDW: 23.2 % — ABNORMAL HIGH (ref 11.5–14.5)

## 2011-04-18 LAB — CBC WITH DIFFERENTIAL/PLATELET
Basophil #: 0 10*3/uL (ref 0.0–0.1)
Basophil %: 0 %
Eosinophil #: 0.2 10*3/uL (ref 0.0–0.7)
Eosinophil %: 2.1 %
HCT: 29.4 % — ABNORMAL LOW (ref 40.0–52.0)
HGB: 8.8 g/dL — ABNORMAL LOW (ref 13.0–18.0)
Lymphocyte %: 7.1 %
MCHC: 30 g/dL — ABNORMAL LOW (ref 32.0–36.0)
MCV: 67 fL — ABNORMAL LOW (ref 80–100)
Neutrophil #: 7.2 10*3/uL — ABNORMAL HIGH (ref 1.4–6.5)
RBC: 4.4 10*6/uL (ref 4.40–5.90)
RDW: 22 % — ABNORMAL HIGH (ref 11.5–14.5)

## 2011-04-18 LAB — BASIC METABOLIC PANEL
Anion Gap: 17 — ABNORMAL HIGH (ref 7–16)
Co2: 19 mmol/L — ABNORMAL LOW (ref 21–32)
Creatinine: 1.6 mg/dL — ABNORMAL HIGH (ref 0.60–1.30)
EGFR (African American): 53 — ABNORMAL LOW
EGFR (Non-African Amer.): 44 — ABNORMAL LOW
Osmolality: 293 (ref 275–301)
Sodium: 146 mmol/L — ABNORMAL HIGH (ref 136–145)

## 2011-04-19 LAB — BASIC METABOLIC PANEL
Anion Gap: 13 (ref 7–16)
BUN: 20 mg/dL — ABNORMAL HIGH (ref 7–18)
Calcium, Total: 8.7 mg/dL (ref 8.5–10.1)
EGFR (African American): 48 — ABNORMAL LOW
EGFR (Non-African Amer.): 39 — ABNORMAL LOW
Glucose: 101 mg/dL — ABNORMAL HIGH (ref 65–99)
Osmolality: 286 (ref 275–301)
Potassium: 3.7 mmol/L (ref 3.5–5.1)

## 2011-04-20 LAB — BASIC METABOLIC PANEL
Calcium, Total: 8.6 mg/dL (ref 8.5–10.1)
Co2: 27 mmol/L (ref 21–32)
EGFR (African American): 46 — ABNORMAL LOW
EGFR (Non-African Amer.): 38 — ABNORMAL LOW
Glucose: 92 mg/dL (ref 65–99)
Osmolality: 280 (ref 275–301)
Potassium: 3.4 mmol/L — ABNORMAL LOW (ref 3.5–5.1)
Sodium: 139 mmol/L (ref 136–145)

## 2011-04-20 LAB — HEMOGLOBIN: HGB: 8.3 g/dL — ABNORMAL LOW (ref 13.0–18.0)

## 2011-04-21 LAB — HEMOGLOBIN: HGB: 8.3 g/dL — ABNORMAL LOW (ref 13.0–18.0)

## 2011-04-21 LAB — BASIC METABOLIC PANEL
Calcium, Total: 8.4 mg/dL — ABNORMAL LOW (ref 8.5–10.1)
Co2: 23 mmol/L (ref 21–32)
EGFR (African American): 51 — ABNORMAL LOW
Potassium: 3.9 mmol/L (ref 3.5–5.1)
Sodium: 141 mmol/L (ref 136–145)

## 2011-04-22 LAB — CULTURE, BLOOD (SINGLE)

## 2011-05-15 ENCOUNTER — Ambulatory Visit: Payer: Self-pay | Admitting: Internal Medicine

## 2011-05-15 DEATH — deceased

## 2013-07-27 IMAGING — CT CT HEAD WITHOUT CONTRAST
4 of 8 series · 9 of 30 positions shown, 10 images · non-contrast
Comparison: none

REASON FOR EXAM: CVA; right-sided paralysis
COMMENTS:

[Series 2: without · axial · non-contrast · 0.41mm/px · z∈[-132,-78]mm · 2 of 34 slices shown, 3 images]
[im 12/34  brain]
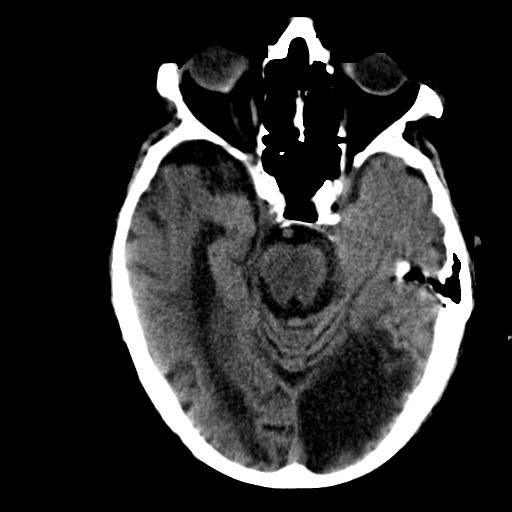
[im 12/34  bone]
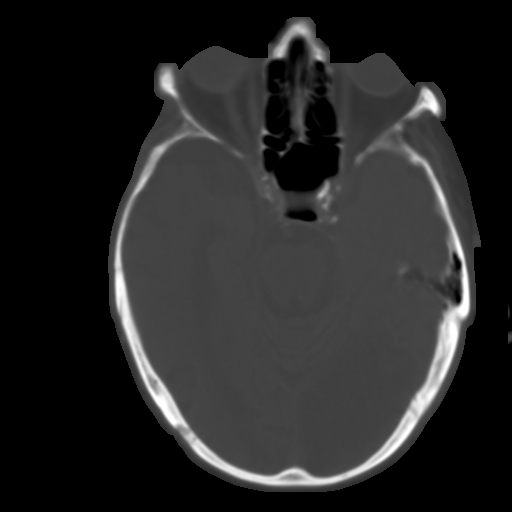
[im 23/34  brain]
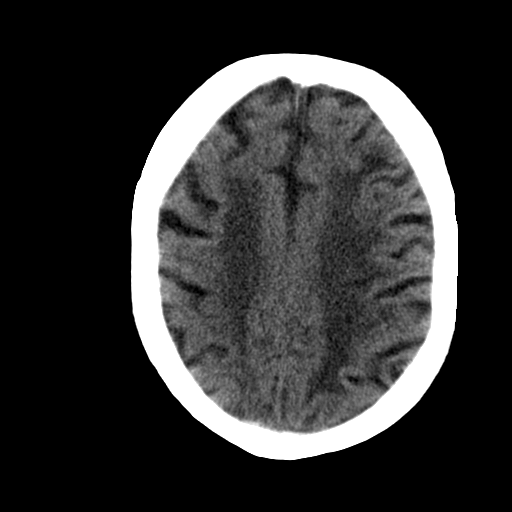

[Series 6: without 2 · axial · non-contrast · 0.41mm/px · z∈[-118,-54]mm · 2 of 42 slices shown]
[im 14/42  brain]
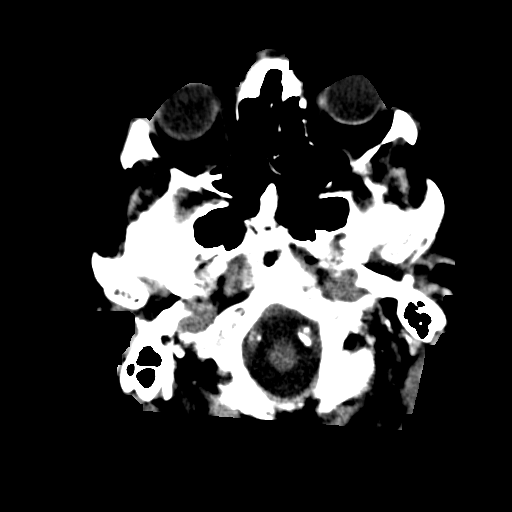
[im 28/42  brain]
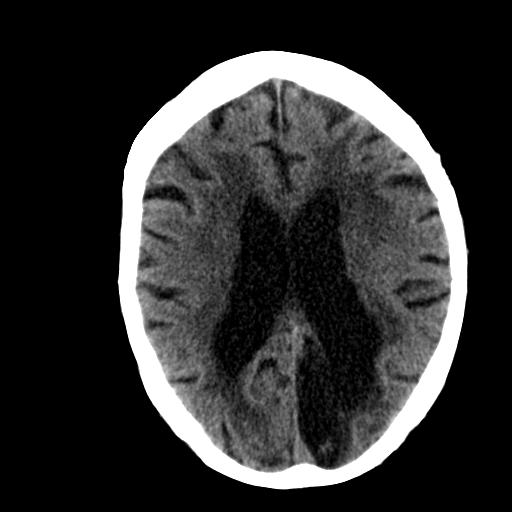

[Series 7: bone 2 · axial · 0.41mm/px · z∈[-125,-31]mm · 3 of 45 slices shown]
[im 12/45  bone]
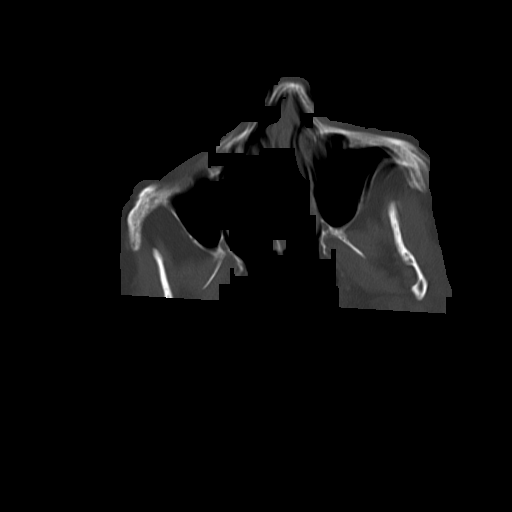
[im 23/45  bone]
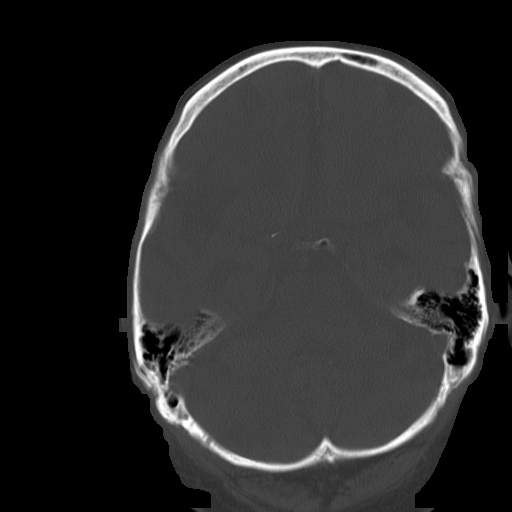
[im 34/45  bone]
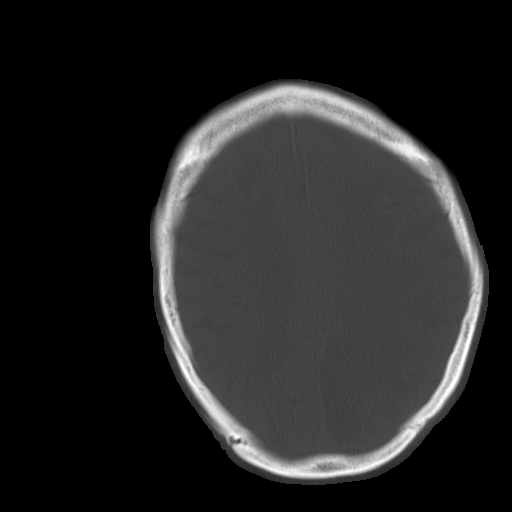

[Series 8: without 4 · axial · non-contrast · 0.41mm/px · z∈[-106,-47]mm · 2 of 44 slices shown]
[im 15/44  brain]
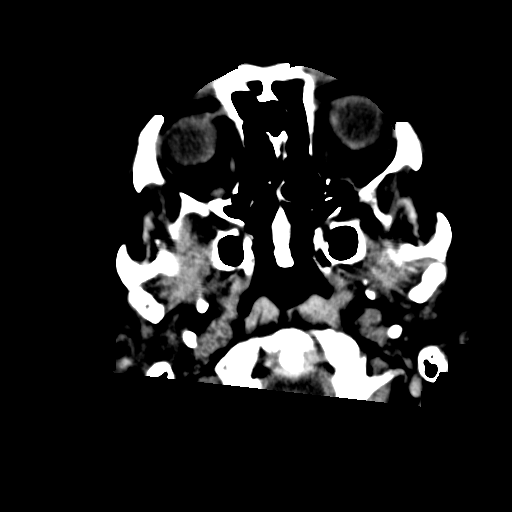
[im 29/44  brain]
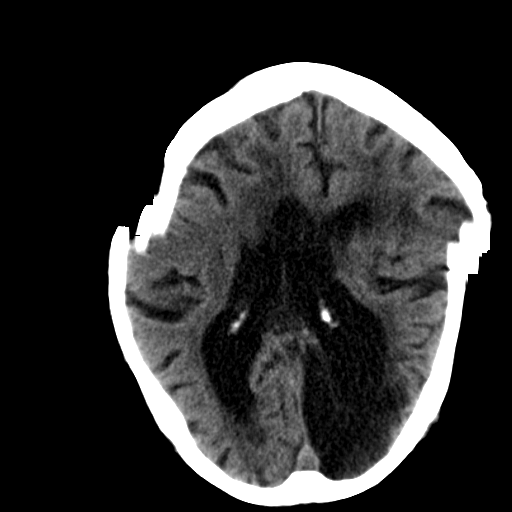

[9 of 30 positions shown; findings below may reference images not displayed]

PROCEDURE:     CT  - CT HEAD WITHOUT CONTRAST  - March 31, 2011  [DATE]

RESULT:     CT of the brain without contrast is compared to the previous
exam dated March 28, 2011 and to images from July 19, 2009.

There is prominence of the ventricles and sulci consistent with diffuse
atrophy. There is a well-defined old left occipital infarct. Extensive
low-attenuation in the periventricular and subcortical white matter is
consistent with chronic small vessel ischemic disease. A small old left
parietal infarct is present near the cranial vertex. There is no
intracranial hemorrhage, mass effect or midline shift. No evolving infarct
or significant change is appreciated. The included sinuses and mastoid air
cells show normal-appearing aeration. No depressed skull fracture is
evident. Images are somewhat degraded by patient motion artifact despite
multiple repeated attempts.
IMPRESSION: Changes of atrophy with chronic small vessel ischemic disease and old
infarcts. No acute intracranial abnormality appreciated.

## 2014-06-07 NOTE — H&P (Signed)
PATIENT NAME:  [name], [name] MR#:  [id number] DATE OF BIRTH:  March 14, 1922  DATE OF ADMISSION:  03/28/2011  PRIMARY CARE PHYSICIAN: Alonna Buckler, MD  CHIEF COMPLAINT: Difficulty speaking.   HISTORY OF PRESENT ILLNESS: This is an 81 year old male who has a history of both hemorrhagic strokes and ischemic strokes. He does have a history of right hemiparesis. Two sons are giving me history. They said he woke up from a nap today and the sitter that stays with him said he could not talk; this happened around 3:30. Since then he has had trouble speaking. He can follow commands. He does try to express himself, but cannot. He was recently seen in the ER about a week ago for possible urinary tract infection.   PAST MEDICAL HISTORY:  1. Cerebrovascular accident. A. Both ischemic and hemorrhagic type. B. Residual right hemiparesis.  2. Stage III chronic renal failure. A. Secondary to embolization from cardiac catheterization in 2005 resulting in dialysis up to March 2011.  3. Atrial fibrillation. A. Not on anticoagulation secondary to history of intracranial hemorrhage.  4. Hypertension.  5. History of diastolic dysfunction with an ejection fraction of 50%.  6. Gastroesophageal reflux disease.  7. Depression.  8. Hyperlipidemia.  9. Coronary artery disease. 10. History of gastrointestinal bleed.  11. Anemia of chronic disease.  12. History of superior vena cava syndrome.  13. Benign prostatic hypertrophy.   PAST SURGICAL HISTORY: Right arm AVM graft placement.   ALLERGIES: Coumadin, hydrochlorothiazide, and Spiriva.   CURRENT MEDICATIONS:  1. Aspirin 81 mg daily.  2. Citalopram 20 mg daily.  3. Clonidine 0.1 mg 1/2 tab twice a day. 4. Potassium 20 mEq daily.  5. Metoprolol 100 mg daily.  6. Rena-Vite 1 tablet daily.  7. Pravastatin 80 mg daily.   SOCIAL HISTORY: He lives at home with his son. He has full-time care with a Comptroller.  FAMILY HISTORY: Significant for diabetes.   REVIEW  OF SYSTEMS: Unable to obtain from the patient because of his expressive aphasia.   PHYSICAL EXAMINATION:   VITAL SIGNS: Temperature 97, pulse 96, respirations 20, blood pressure 133/65.   GENERAL: This is a well-nourished white male in no acute distress.   HEENT: The pupils are equal, round, and reactive to light. Sclerae is anicteric. Oral mucosa is moist. Oropharynx is clear.  Nasopharynx is clear.   NECK: Supple. No JVD, lymphadenopathy or thyromegaly.   CARDIOVASCULAR: Irregular, irregular with no murmurs.   LUNGS: Clear to auscultation. No dullness to percussion. He is not using accessory muscles.   ABDOMEN: Soft, nontender, and nondistended. Bowel sounds are positive. No hepatosplenomegaly. No masses.   EXTREMITIES: No edema. There are contractures in the right upper extremity.   NEUROLOGIC: He has expressive aphasia, otherwise cranial nerves II through XII appear to be intact. He has some right upper extremity hemiplegia. Strength is 4 to 5/5 in the left upper and lower extremities and the right lower extremity.   SKIN: Moist with no rash.   DIAGNOSTIC DATA: CT scan of the brain shows no acute abnormalities.  Hemoglobin 9.1. BUN is 26, creatinine 1.99, sodium 139, and potassium is 4.2.   ASSESSMENT AND PLAN:  1. Acute cerebrovascular accident: He does have new symptoms of expressive aphasia that came on suddenly. CT scan shows no acute bleeding, but with his history of hemorrhagic stroke he certainly is not a TPA candidate. We will go ahead and increase his baby aspirin to full strength aspirin, but at this point with the history  of intracranial bleeding I would not want to go beyond that. He is already on statin and blood pressure medications. I do not want to acutely lower his blood pressure, just keep him on his normal medications. We will get both speech and physical therapy to see him also. I am going to make him n.p.o. for now until the speech evaluation because of risk of  aspiration.  2. Stage III chronic renal failure: We will renal dose medications as needed.  3. Atrial fibrillation: His rate is controlled. He is not a candidate for anticoagulation because of his history of intracranial bleeding.  4. Hyperlipidemia: He is on a statin.   TIME SPENT ON ADMISSION: 55 minutes. ____________________________ Gracelyn NurseJohn D. Marquan Vokes, MD jdj:[id number] D: 03/28/2011 20:06:04 ET     T: 03/29/2011 07:17:09 ET        JOB#: [id number] cc: Gracelyn NurseJohn D. Jayna Mulnix, MD, <Dictator> Reola MosherAndrew S. Randa LynnLamb, MD Gracelyn NurseJOHN D Kashtyn Jankowski MD ELECTRONICALLY SIGNED 03/29/2011 13:00

## 2014-06-07 NOTE — Discharge Summary (Signed)
PATIENT NAME:  [name], [name] MR#:  [id number] DATE OF BIRTH:  [birth year]  DATE OF ADMISSION:  03/28/2011 DATE OF DISCHARGE:  03/31/2011  DIAGNOSES:  1. Possible recurrent acute cerebrovascular accident.  2. Possible pneumonia. 3. Anemia.  4. Elevated thyroid stimulating hormone. 5. Chronic kidney disease, stage III. 6. Atrial fibrillation. 7. Hyperlipidemia. 8. History of coronary artery disease.   DISPOSITION: Patient is being discharged to rehab facility.   DIET: Low sodium, mechanical soft.   ACTIVITY: As tolerated. Patient should continue PT, OT, and speech therapy.   FOLLOW UP: Follow up with primary care physician, Dr. Alonna BucklerAndrew Lamb, in 1 to 2 weeks after discharge.   DISCHARGE MEDICATIONS:  1. Oxygen 2 liters via nasal cannula.  2. Tylenol 650 mg every six hours p.r.n.  3. Zofran 4 mg ODT every six hours p.r.n. nausea, vomiting. 4. Levaquin 250 mg daily for five days.  5. Omeprazole 20 mg daily.  6. Pravachol 80 mg daily.  7. Rena-Vite 1 tablet daily.  8. Clonidine 0.05 mg b.i.d.  9. Citalopram 20 mg daily.   CODE STATUS: DO NOT RESUSCITATE.   LABORATORY, DIAGNOSTIC AND RADIOLOGICAL DATA: Initial CAT scan of the head showed left occipital encephalomalacia with white matter changes consistent with chronic ischemia. Repeat CAT scan of the head showed no acute intracranial abnormality. Chest x-ray showed increased density in the lung base greater on the right than on the left with concern for developing right lung base pneumonia. TSH was high. Free T4 was normal. Free T3 was low. CBC normal other than hemoglobin of 9.1. Chronic kidney disease, creatinine ranging from 1.99 to 1.64. Iron studies showed low iron but normal ferritin. Cardiac enzymes normal.   HOSPITAL COURSE: Patient is an 81 year old male with history of prior CVA both hemorrhagic and ischemic with residual right-sided hemiparesis, chronic kidney disease. He was on hemodialysis until 03/11, atrial fibrillation  not on anticoagulation secondary to intracranial hemorrhage who presented with expressive aphasia. He was admitted with a diagnosis of positive recurrent acute cerebrovascular accident. His initial CAT scan showed left occipital encephalomalacia from prior CVA. Patient was unable to get an MRI because he was not able to lay flat on his back. A repeat CAT scan of the head was unrevealing. He was treated with aspirin and received PT, OT, and speech therapy while in the hospital. Patient's family was desirous of placement therefore rehab facility was arranged for the patient. Patient was hypoxic on room air and was placed on oxygen. Chest x-ray showed possible right lower lobe atelectasis versus developing infiltrate therefore he was treated with empiric antibiotics. His white count was normal and he was afebrile. He had microcytic anemia. Iron studies were done. His iron was low but ferritin was normal. He had elevated TSH, T4 was normal but T3 was low. Patient possibly has subclinical hypothyroidism but it was felt best not to treat given his history of atrial fibrillation. As per studies there is no benefit of starting treatment with Synthroid unless the TSH is more than or equal to 10. Patient had chronic kidney disease. All his medications were renally dosed. His creatinine has improved slightly with fluids. He has rate controlled atrial fibrillation. He is not a candidate for anticoagulation due to history of intracranial bleed. Patient is being discharged to a rehab facility in a stable condition.   TIME SPENT: 45 minutes.  ____________________________ Darrick MeigsSangeeta Donald Wallander, MD sp:cms D: 04/01/2011 08:03:00 ET T: 04/01/2011 08:36:06 ET JOB#: [id number]  cc: Darrick MeigsSangeeta Rivan Siordia, MD, <Dictator> Greig CastillaAndrew  Jasper Loser, MD Darrick Meigs MD ELECTRONICALLY SIGNED 04/01/2011 13:11

## 2014-06-07 NOTE — H&P (Signed)
PATIENT NAME:  [name], [name] MR#:  [id number] DATE OF BIRTH:  Jul 05, 1922  DATE OF ADMISSION:  04/17/2011  PRIMARY CARE PHYSICIAN: Dr. Fidela Juneau   CHIEF COMPLAINT: Altered mental status, apnea.   HISTORY OF PRESENT ILLNESS: This is an 81 year old male who was just recently discharged from the hospital and presents to the hospital from [facility] skilled nursing facility after an episode where he became somewhat confused and altered. As per the son at bedside the patient was sort of out of it.  They tried to shake him and wake him up, although he was not responsive. He did not have a true syncopal episode or anything like that. This lasted for about a minute or so and he was brought to the hospital for further evaluation. As per the son, the patient also has had a couple of episodes of apnea where he feels that the patient stops breathing for quite a long time and then has a gasping breath of air. He has never officially been diagnosed with sleep apnea. The patient presently denies any systemic complaints. He denies any fevers. No chest pain. No shortness of breath. No nausea. No vomiting. No abdominal pain. No headache. No dizziness. No other associated symptoms presently.   REVIEW OF SYSTEMS: CONSTITUTIONAL:  No documented fever. No weight gain or weight loss. EYES: No blurry or double vision. ENT: No tinnitus or postnasal drip. No redness of the oropharynx. RESPIRATORY: No cough, no wheeze, no hemoptysis, no dyspnea. CARDIOVASCULAR: No chest pain, no orthopnea, no palpitations or syncope. GI: No nausea, no vomiting, no diarrhea, no abdominal pain, no melena, no hematochezia. GU: No dysuria or hematuria. ENDOCRINE: No polyuria, nocturia, or heat or cold intolerance. HEME: No anemia, no bruising, no bleeding. INTEGUMENT: No rashes. No lesions. MUSCULOSKELETAL: No arthritis, no swelling, no gout.  NEUROLOGIC: No numbness, no tingling, no ataxia, no seizure-type activity. PSYCH: No anxiety, no  insomnia, no ADD.   PAST MEDICAL HISTORY:  1. History of previous cerebrovascular accident.  2. Chronic kidney disease, stage III.  3. Chronic atrial fibrillation.  4. Hypertension.  5. Hyperlipidemia.  6. Coronary disease.  7. Depression.   ALLERGIES: Coumadin, hydralazine, hydrochlorothiazide, reserpine, Spiriva, and Uroxatral.   SOCIAL HISTORY: No smoking. No alcohol abuse. No illicit drug abuse. Currently resides at a skilled nursing facility.  FAMILY HISTORY: Significant for diabetes.   CURRENT MEDICATIONS:  1. Tylenol 650 q. 6 h. p.r.n.  2. Aspirin 325 mg daily.  3. Celexa 20 mg daily.  4. Clonidine 0.05 mg b.i.d.  5. Metoprolol 25 mg b.i.d.  6. Omeprazole 20 mg daily.  7. Zofran 4 mg q. 6 hours p.r.n.  8. Pravachol 80 mg daily.  9. Rena-Vite 1 tab daily.   PHYSICAL EXAMINATION ON ADMISSION:  VITAL SIGNS:  Temperature is afebrile. Pulse 90, respirations 20, blood pressure 134/79, sats 98% on 2 liters nasal cannula.   GENERAL: He is a lethargic -appearing male but in no apparent distress.   HEENT: He is atraumatic, normocephalic. His extraocular muscles are intact. Pupils are equal and reactive to light. Sclerae anicteric. No conjunctival injection. No pharyngeal erythema.   NECK: Supple. No jugular venous distention, bruits, lymphadenopathy, or thyromegaly.   HEART: Regular rate and rhythm. He does have a 2/6 systolic ejection murmur heard at the apex.   LUNGS: Clear to auscultation bilaterally. No rales, no rhonchi, no wheezes.   ABDOMEN: Soft, flat, nontender, nondistended. Has good bowel sounds. No hepatosplenomegaly appreciated.   EXTREMITIES: No evidence of any  cyanosis, clubbing, or peripheral edema. Has faint pedal pulses with +2 radial pulses bilaterally.   NEUROLOGICAL: He is alert, awake, and oriented times two. Globally weak. No focal motor or sensory deficits appreciated bilaterally.   SKIN: Moist and warm with no rashes.   LYMPHATIC: There is no  cervical or axillary lymphadenopathy.   LABORATORY, DIAGNOSTIC, AND RADIOLOGICAL DATA:  Serum glucose 111, BUN 22, creatinine 1.6, sodium 145, potassium 4.1, chloride 112, bicarbonate 23. LFTs are within normal limits. White cell count 7.5, hemoglobin 9.1, hematocrit 30.5, platelet count 287. Urinalysis shows 3+ leukocyte esterase,  42 white cells.   The patient did have a CT scan of the chest done without contrast showing moderate size bilateral pleural effusions with present compressive atelectasis. No pericardial effusion. No cardiomegaly, atherosclerotic disease.   ASSESSMENT AND PLAN: This is an 81 year old male with past medical history of cerebrovascular accident, intracerebral hemorrhage, chronic atrial fibrillation, chronic kidney disease stage III, history of coronary artery disease, and hyperlipidemia who came into the hospital with altered mental status, confusion, and a suspected apneic episode and also noted to have bilateral pleural effusions.  1. Altered mental status/confusion: The exact etiology is currently unclear. I suspect this is due to underlying vascular dementia complicated with underlying urinary tract infection. I will give the patient IV antibiotics for now and follow his mental status closely. I do not think there is a suspicion for CVA. The patient has had two recent head CTs in the past month, both of which have been negative.  2. Bilateral pleural effusions: This is an incidental finding after looking at the patient's chest x-ray. He does not appear to be hypoxic and does not complain of shortness of breath. I do not suspect this is a parapneumonic effusion as the patient is afebrile and has a normal white cell count. He does have a history of chronic atrial fibrillation so this could be congestive heart failure. The family does not want aggressive work-up at this point. Therefore, I will start the patient on some low dose diuretics and follow him clinically. We will also  consider getting a two-dimensional echocardiogram.  3. Chronic atrial fibrillation: The patient currently is rate controlled. I will continue his metoprolol for now. He is not a candidate for long-term anticoagulation given his intracerebral bleed; therefore, he is on aspirin. I will continue that for now.  4. Chronic kidney disease, stage III: His creatinine is at baseline. I will follow his BUN and creatinine as the patient is going to be on diuretics. Renal dose his medications, avoid nephrotoxins.  5. Urinary tract infection: Continue with IV Rocephin. Follow urine cultures.  6. Hyperlipidemia: Continue Pravachol.  7. Hypertension, presently hemodynamically stable: Continue metoprolol and clonidine.  8. Depression: Continue with Celexa.  9. CODE STATUS: The patient is a DO NOT INTUBATE, DO NOT RESUSCITATE.    TIME SPENT ON ADMISSION: 50 minutes    ____________________________ Rolly PancakeVivek J. Cherlynn KaiserSainani, MD vjs:bjt D: 04/17/2011 15:51:05 ET T: 04/17/2011 16:08:06 ET JOB#: [id number]  cc: Rolly PancakeVivek J. Cherlynn KaiserSainani, MD, <Dictator> Reola MosherAndrew S. Randa LynnLamb, MD Houston SirenVIVEK J SAINANI MD ELECTRONICALLY SIGNED 04/17/2011 21:14

## 2014-06-07 NOTE — Consult Note (Signed)
PATIENT NAME:  [name], LYSTER MR#:  [id number] DATE OF BIRTH:  [birth year]  DATE OF CONSULTATION:  03/21/2011  REFERRING PHYSICIAN:   CONSULTING PHYSICIAN:  Jameison Haji A. Allena Katz, MD  PRIMARY CARE PHYSICIAN: Dr. Randa Lynn   CHIEF COMPLAINT: Patient really does not know why he is here.   HISTORY OF PRESENT ILLNESS: [name] is an 81 year old Caucasian gentleman with multiple medical problems, is a very poor historian brought in by family because he told the family members he is sick, needs to go to the hospital. Patient despite asking several times by family and by me he is not able to pinpoint any one complaint that he is here for. He is hemodynamically stable, no fever. He has been eating and drinking well at home. He uses a walker and he has caregivers during the daytime who help him around. No reported falls by family. Patient in the Emergency Room remained hemodynamically stable. He did not have any symptoms of dysuria. His urine showed 13 WBCs, no bacteria and 1+ leukocyte esterase. Patient denied any chest pain, shortness of breath, any cough or any other symptoms.   PAST MEDICAL HISTORY:  1. Coronary artery disease.  2. History of congestive heart failure.  3. History of chronic kidney disease with creatinine baseline of 2.  4. Anemia of chronic disease.  5. Chronic atrial fibrillation. 6. History of urinary tract infection. He was recently treated with Cipro about a month and a half ago.  7. Gastroesophageal reflux disease.  8. Former smoker, quit 30 years ago.  9. History of cerebrovascular accident.  10. History of colon polyps.  11. Hyperlipidemia.  12. Bilateral cataracts.  13. Allergic rhinitis.  14. Benign prostatic hypertrophy.    MEDICATIONS:  1. Tylenol 1 to 2 orally as needed. 2. Rena-Vite oral tablet daily.  3. Pravastatin 80 mg daily.  4. Potassium 20 mEq p.o. daily.  5. Omeprazole 20 mg p.o. daily.  6. Metoprolol 100 mg extended release p.o. daily.  7. Furosemide 20  mg p.o. daily.  8. Clonidine 0.05 mg orally twice a day.  9. Citalopram 20 mg daily.  10. Aspirin 81 mg p.o. daily.   ALLERGIES: Coumadin, hydrochlorothiazide, Spiriva.   REVIEW OF SYSTEMS: Again unobtainable. Patient is not able to pinpoint any one problem. His review of systems is not obtainable.  PHYSICAL EXAMINATION:  GENERAL: Patient is awake, alert, oriented x3, not in acute distress.   VITAL SIGNS: Afebrile, pulse 76, blood pressure 123/73, sats 93% on room air.   HEENT: Atraumatic, normocephalic. Pupils are equal, round, and reactive to light and accommodation. Extraocular movements intact. Oral mucosa is moist.   NECK: Supple. No JVD. No carotid bruits.   RESPIRATORY: Clear to auscultation bilaterally. No rales, rhonchi, respiratory distress or labored breathing.   CARDIOVASCULAR: Both the heart sounds are normal. Rate, rhythm is regular. PMI not lateralized. Chest nontender.   EXTREMITIES: Good pedal pulses, good femoral pulses. No lower extremity edema.   NEUROLOGICAL: Patient moves all the extremities well. He has got good strength in both upper and lower extremities. Gait not tested.   SKIN: Warm and dry.   LABORATORY, DIAGNOSTIC AND RADIOLOGICAL DATA: EKG shows chronic atrial fibrillation. Troponin 0.06, 0.08. Urinalysis 1+ leukocyte esterase, 13 WBCs, no bacteria. Chest x-ray: No pneumonia. Stable cardiomegaly. Changes of pulmonary vascular congestion and bilateral pleural effusion consistent with chronic congestive heart failure Creatinine 2.0. Rest of the chemistry is normal. SGPT 11. Albumin 3.1. CBC: Hemoglobin and hematocrit 9.4 and 30.0, MCV 68.  ASSESSMENT: 81 year old Mr. [name] with:  1. Generalized weakness.  2. Asymptomatic mild urinary tract infection.  3. Chronic kidney disease stage III, baseline creatinine 2.  4. Chronic atrial fibrillation.  5. Hypertension.  6. Mild dementia.   RECOMMENDATIONS: Patient is not able to give me any further  details regarding his symptoms, neither can his family get any history from the patient. His vitals in the Emergency Room remained stable at baseline. All his work-up was essentially negative. No source of infection was found other than mild asymptomatic urinary tract infection. His heart rate is in the 80s. Labs remained stable since 12/2010. Discussed with patient's son and daughter-in-law. Patient appears clinically at baseline. Will treat with p.o. Levaquin for mild asymptomatic urinary tract infection. Patient has caregiver at home, uses walker. Family okay to take patient back home. Will follow up with Dr. Randa LynnLamb this week and advised to return to Emergency Room if needed.   TIME SPENT: 45 minutes.  ____________________________ Wylie HailSona A. Allena KatzPatel, MD sap:cms D: 03/21/2011 20:47:39 ET T: 03/22/2011 08:52:25 ET JOB#: [id number]  cc: Weaver Tweed A. Allena KatzPatel, MD, <Dictator> Reola MosherAndrew S. Randa LynnLamb, MD  Willow OraSONA A Lysbeth Dicola MD ELECTRONICALLY SIGNED 03/23/2011 7:40

## 2014-06-07 NOTE — Discharge Summary (Signed)
PATIENT NAME:  Donald BanksHENDERSON, Xiomar L MR#:  [id number] DATE OF BIRTH:  Oct 04, 1922  DATE OF ADMISSION:  04/17/2011 DATE OF DISCHARGE:  04/21/2011   TYPE OF DISCHARGE: The patient is getting discharged to Hospice home.   DISCHARGE DIAGNOSES:  1. Encephalopathy. 2. Urinary tract infection.  3. Bilateral pleural effusions. 4. Severe aortic stenosis. 5. Cardiomyopathy. 6. History of cerebrovascular accident. 7. Hypertension. 8. Depression. 9. Anemia. 10. Failure to thrive. 11. Chronic atrial fibrillation.  12. Chronic kidney disease, stage III.   CONSULT: Palliative Care    HOSPITAL COURSE: The patient is an 81 year old male with multiple medical problems. He has chronic atrial fibrillation, history of multiple CVAs in the past, CKD stage III, hypertension, hyperlipidemia, and coronary artery disease. He was recently discharged from the hospital in February. He was suspected of possible CVA at that time. This admission ,he presented with altered mental status and apnea. He was confused. He was not very well responsive. When he came in to the Emergency Room, he was suspected to have encephalopathy secondary to UTI. His urinalysis showed 3+ leukocyte esterase, 42 WBCs per high-power field. His white count was normal at 7.5. Hemoglobin was 9.1. His creatinine was in the range of 1.66 which is his baseline. He was started on IV ceftriaxone for UTI. He also had a CT of the chest done at the time of admission. His initial chest x-ray at admission showed increasing interstitial findings with increased density in the right lower lobe concerning for worsening infiltrate and increasing edema. CT chest without contrast was done which showed moderate-sized bilateral pleural effusions with compressive atelectasis, difficult to assess underlying infiltrate. The patient was started on IV Lasix initially which was changed to p.o. Lasix. Because of questionable pneumonia, Zithromax was added to ceftriaxone. He had an  echocardiogram done which showed that he has cardiomyopathy, EF of 35 to 40%, mild to moderate reduced LV function, severe aortic stenosis, left ventricular hypertrophy, and moderate MR so most likely these are transudative effusions because of cardiomyopathy and severe AS. The patient had worsening creatinine on Lasix. His creatinine went up to 1.8 so Lasix holiday was given. The patient has been declining lately. He is not tolerating any PT. He is minimally verbal. His p.o. intake is minimal. He has been on a downhill state. A CT of the head was also done and that showed changes of advanced atrophy, chronic small vessel ischemic disease with a large old left occipital infarct. His blood cultures remained negative. He had slight worsening of his hemoglobin but stable at 8.3. I am not going to continue Lasix on him because he has minimal p.o. intake and he will get more dehydrated with Lasix. Palliative Care was consulted. They talked to the family. Considering his failure to thrive, his minimal p.o. intake, and his albumin of 2.5, they suggested a Hospice screen. Ginny from Hospice saw him and he is appropriate for Hospice home. The family decided on COMFORT MEASURES and we are going to discharge him to Hospice home today.   MEDICATIONS:  1. Oxygen 2 liters per minute. 2. Leave Foley catheter for urinary incontinence for prevention of skin breakdown.  3. Roxanol 20 mg/mL 0.25 to 0.5 mL p.o. sublingual every 1 to 2 hours as needed for pain or dyspnea.  4. Ativan 0.5 to 1 mg p.o. sublingual every 2 to 4 hours p.r.n. for agitation and anxiety.  5. ABHR suppository q.4 to 6 hours p.r.n.  6. Clonidine 0.5 mg b.i.d.  7. Metoprolol 25  mg daily.  8. Omeprazole 20 mg daily.  9. Citalopram 20 mg daily.  10. Zofran 4 mg q.6 hours as needed. 11. Acetaminophen 650 mg q.6 hours as needed.   DO NOT TAKE: I have stopped his pravastatin because he is COMFORT CARE right now.   ADDITIONAL MEDICATIONS:  1. Ceftin 250  mg p.o. b.i.d. for six days.  2. Zithromax 250 mg once daily for two days.   3. Ecotrin 81 mg once daily.   CONDITION AT DISCHARGE: Poor, very withdrawn. T-max 97.6, heart rate 78, blood pressure 105/55, saturating 99% on 2 liters nasal cannula. Oral mucosa is dry. Poor general condition, wasted appearance. Overall very poor prognosis.  DIET: 2 gram sodium, mechanical soft, pureed meats.   TIME SPENT WITH DISCHARGE: 45 minutes.   ____________________________ Fredia Sorrow, MD ag:drc D: 04/21/2011 10:45:55 ET T: 04/21/2011 11:12:11 ET JOB#: [id number]  cc: Fredia Sorrow, MD, <Dictator> Reola Mosher. Randa Lynn, MD Fredia Sorrow MD ELECTRONICALLY SIGNED 04/24/2011 11:39
